# Patient Record
Sex: Male | Born: 1950 | Race: Black or African American | Hispanic: No | Marital: Single | Smoking: Never smoker
Health system: Southern US, Community
[De-identification: ages and names within clinical notes are randomized; demographics above are authoritative.]

## PROBLEM LIST (undated history)

## (undated) DIAGNOSIS — I10 Essential (primary) hypertension: Secondary | ICD-10-CM

---

## 2015-06-30 ENCOUNTER — Emergency Department (HOSPITAL_COMMUNITY): Payer: Self-pay

## 2015-06-30 ENCOUNTER — Encounter (HOSPITAL_COMMUNITY): Payer: Self-pay | Admitting: Emergency Medicine

## 2015-06-30 ENCOUNTER — Emergency Department (HOSPITAL_COMMUNITY)
Admission: EM | Admit: 2015-06-30 | Discharge: 2015-06-30 | Disposition: A | Discharge status: Home / Self Care | Payer: Self-pay | Attending: Emergency Medicine | Admitting: Emergency Medicine

## 2015-06-30 DIAGNOSIS — I10 Essential (primary) hypertension: Secondary | ICD-10-CM | POA: Insufficient documentation

## 2015-06-30 DIAGNOSIS — M791 Myalgia, unspecified site: Secondary | ICD-10-CM

## 2015-06-30 HISTORY — DX: Essential (primary) hypertension: I10

## 2015-06-30 LAB — CBC WITH DIFFERENTIAL/PLATELET
BASOS ABS: 0 10*3/uL (ref 0.0–0.1)
Basophils Relative: 0 %
EOS PCT: 2 %
Eosinophils Absolute: 0.1 10*3/uL (ref 0.0–0.7)
HEMATOCRIT: 39.7 % (ref 39.0–52.0)
Hemoglobin: 12.9 g/dL — ABNORMAL LOW (ref 13.0–17.0)
LYMPHS ABS: 2.6 10*3/uL (ref 0.7–4.0)
LYMPHS PCT: 44 %
MCH: 26.7 pg (ref 26.0–34.0)
MCHC: 32.5 g/dL (ref 30.0–36.0)
MCV: 82 fL (ref 78.0–100.0)
MONO ABS: 0.5 10*3/uL (ref 0.1–1.0)
MONOS PCT: 9 %
NEUTROS ABS: 2.7 10*3/uL (ref 1.7–7.7)
Neutrophils Relative %: 45 %
PLATELETS: 207 10*3/uL (ref 150–400)
RBC: 4.84 MIL/uL (ref 4.22–5.81)
RDW: 13.1 % (ref 11.5–15.5)
WBC: 6 10*3/uL (ref 4.0–10.5)

## 2015-06-30 LAB — BASIC METABOLIC PANEL
ANION GAP: 5 (ref 5–15)
BUN: 15 mg/dL (ref 6–20)
CHLORIDE: 111 mmol/L (ref 101–111)
CO2: 26 mmol/L (ref 22–32)
Calcium: 9.3 mg/dL (ref 8.9–10.3)
Creatinine, Ser: 0.91 mg/dL (ref 0.61–1.24)
GFR calc Af Amer: >60 mL/min (ref >60)
GFR calc non Af Amer: >60 mL/min (ref >60)
GLUCOSE: 90 mg/dL (ref 65–99)
POTASSIUM: 3.8 mmol/L (ref 3.5–5.1)
Sodium: 142 mmol/L (ref 135–145)

## 2015-06-30 LAB — I-STAT CHEM 8, ED
BUN: 15 mg/dL (ref 6–20)
CALCIUM ION: 1.24 mmol/L (ref 1.13–1.30)
CREATININE: 0.9 mg/dL (ref 0.61–1.24)
Chloride: 108 mmol/L (ref 101–111)
GLUCOSE: 87 mg/dL (ref 65–99)
HCT: 42 % (ref 39.0–52.0)
HEMOGLOBIN: 14.3 g/dL (ref 13.0–17.0)
POTASSIUM: 3.7 mmol/L (ref 3.5–5.1)
Sodium: 144 mmol/L (ref 135–145)
TCO2: 23 mmol/L (ref 0–100)

## 2015-06-30 LAB — I-STAT TROPONIN, ED
TROPONIN I, POC: 0 ng/mL (ref 0.00–0.08)
Troponin i, poc: 0.01 ng/mL (ref 0.00–0.08)

## 2015-06-30 MED ORDER — KETOROLAC TROMETHAMINE 30 MG/ML IJ SOLN
30.0000 mg | Freq: Once | INTRAMUSCULAR | Status: AC
  Administered 2015-06-30: 30 mg via INTRAVENOUS
  Filled 2015-06-30: qty 1

## 2015-06-30 MED ORDER — NAPROXEN 375 MG PO TABS: 375.0000 mg | ORAL_TABLET | Freq: Two times a day (BID) | ORAL | Status: AC

## 2015-06-30 MED ORDER — GI COCKTAIL ~~LOC~~
30.0000 mL | Freq: Once | ORAL | Status: AC
Start: 2015-06-30 — End: 2015-06-30
  Administered 2015-06-30: 30 mL via ORAL
  Filled 2015-06-30: qty 30

## 2015-06-30 MED ORDER — AMLODIPINE BESYLATE 5 MG PO TABS: 5.0000 mg | ORAL_TABLET | Freq: Every day | ORAL | Status: AC

## 2015-06-30 MED ORDER — IOHEXOL 350 MG/ML SOLN
100.0000 mL | Freq: Once | INTRAVENOUS | Status: AC | PRN
  Administered 2015-06-30: 100 mL via INTRAVENOUS

## 2015-06-30 MED ORDER — METHOCARBAMOL 500 MG PO TABS
1000.0000 mg | ORAL_TABLET | Freq: Once | ORAL | Status: AC
  Administered 2015-06-30: 1000 mg via ORAL
  Filled 2015-06-30: qty 2

## 2015-06-30 MED ORDER — AMLODIPINE BESYLATE 5 MG PO TABS
5.0000 mg | ORAL_TABLET | Freq: Once | ORAL | Status: AC
  Administered 2015-06-30: 5 mg via ORAL
  Filled 2015-06-30: qty 1

## 2015-06-30 NOTE — ED Provider Notes (Signed)
CSN: [id number]     Arrival date & time 06/30/15  0344 History   First MD Initiated Contact with Patient 06/30/15 0354     Chief Complaint  Patient presents with  . Chest Pain     (Consider location/radiation/quality/duration/timing/severity/associated sxs/prior Treatment) Patient is a 64 y.o. male presenting with chest pain. The history is provided by the patient.  Chest Pain Pain location:  L chest Radiates to: back and legs to feet. Pain radiates to the back: yes   Pain severity:  Severe Onset quality:  Sudden Timing:  Constant Progression:  Unchanged Chronicity:  New Context: at rest   Relieved by:  Nothing Worsened by:  Nothing tried Ineffective treatments:  None tried Associated symptoms: no cough, no headache, no lower extremity edema, no palpitations, no shortness of breath and not vomiting   Risk factors: no surgery     Past Medical History  Diagnosis Date  . Hypertension    History reviewed. No pertinent past surgical history. No family history on file. Social History  Substance Use Topics  . Smoking status: Never Smoker   . Smokeless tobacco: None  . Alcohol Use: No    Review of Systems  Respiratory: Negative for cough and shortness of breath.   Cardiovascular: Positive for chest pain. Negative for palpitations and leg swelling.  Gastrointestinal: Negative for vomiting.  Neurological: Negative for headaches.  All other systems reviewed and are negative.     Allergies  Review of patient's allergies indicates no known allergies.  Home Medications   Prior to Admission medications   Not on File   There were no vitals taken for this visit. Physical Exam  Constitutional: He is oriented to person, place, and time. He appears well-developed and well-nourished. No distress.  HENT:  Head: Normocephalic and atraumatic.  Mouth/Throat: Oropharynx is clear and moist.  Eyes: Conjunctivae are normal. Pupils are equal, round, and reactive to light.  Neck:  Normal range of motion. Neck supple.  Cardiovascular: Normal rate, regular rhythm and intact distal pulses.   Pulmonary/Chest: Effort normal and breath sounds normal. No respiratory distress. He has no wheezes. He has no rales. He exhibits no tenderness.  Abdominal: Soft. Bowel sounds are normal. There is no tenderness. There is no rebound and no guarding.  Musculoskeletal: Normal range of motion.  Neurological: He is alert and oriented to person, place, and time.  Skin: Skin is warm and dry.  Psychiatric: He has a normal mood and affect.    ED Course  Procedures (including critical care time) Labs Review Labs Reviewed  BASIC METABOLIC PANEL  CBC  CBC WITH DIFFERENTIAL/PLATELET  I-STAT CHEM 8, ED  I-STAT TROPOININ, ED    Imaging Review No results found. I have personally reviewed and evaluated these images and lab results as part of my medical decision-making.   EKG Interpretation None      MDM   Final diagnoses:  None     EKG Interpretation  Date/Time:  Sunday June 30 2015 03:46:21 EDT Ventricular Rate:  53 PR Interval:  96 QRS Duration: 85 QT Interval:  223 QTC Calculation: 209 R Axis:   78 Text Interpretation:  Sinus rhythm Short PR interval Confirmed by [facility]  MD, Cieara Stierwalt ([id number]) on 06/30/2015 3:57:12 AM      Results for orders placed or performed during the hospital encounter of 06/30/15  Basic metabolic panel  Result Value Ref Range   Sodium 142 135 - 145 mmol/L   Potassium 3.8 3.5 - 5.1 mmol/L  Chloride 111 101 - 111 mmol/L   CO2 26 22 - 32 mmol/L   Glucose, Bld 90 65 - 99 mg/dL   BUN 15 6 - 20 mg/dL   Creatinine, Ser 1.61 0.61 - 1.24 mg/dL   Calcium 9.3 8.9 - 09.6 mg/dL   GFR calc non Af Amer >60 >60 mL/min   GFR calc Af Amer >60 >60 mL/min   Anion gap 5 5 - 15  CBC with Differential/Platelet  Result Value Ref Range   WBC 6.0 4.0 - 10.5 K/uL   RBC 4.84 4.22 - 5.81 MIL/uL   Hemoglobin 12.9 (L) 13.0 - 17.0 g/dL   HCT 04.5 40.9 - 81.1  %   MCV 82.0 78.0 - 100.0 fL   MCH 26.7 26.0 - 34.0 pg   MCHC 32.5 30.0 - 36.0 g/dL   RDW 91.4 78.2 - 95.6 %   Platelets 207 150 - 400 K/uL   Neutrophils Relative % 45 %   Neutro Abs 2.7 1.7 - 7.7 K/uL   Lymphocytes Relative 44 %   Lymphs Abs 2.6 0.7 - 4.0 K/uL   Monocytes Relative 9 %   Monocytes Absolute 0.5 0.1 - 1.0 K/uL   Eosinophils Relative 2 %   Eosinophils Absolute 0.1 0.0 - 0.7 K/uL   Basophils Relative 0 %   Basophils Absolute 0.0 0.0 - 0.1 K/uL  I-Stat Chem 8, ED  Result Value Ref Range   Sodium 144 135 - 145 mmol/L   Potassium 3.7 3.5 - 5.1 mmol/L   Chloride 108 101 - 111 mmol/L   BUN 15 6 - 20 mg/dL   Creatinine, Ser 2.13 0.61 - 1.24 mg/dL   Glucose, Bld 87 65 - 99 mg/dL   Calcium, Ion 0.86 5.78 - 1.30 mmol/L   TCO2 23 0 - 100 mmol/L   Hemoglobin 14.3 13.0 - 17.0 g/dL   HCT 46.9 62.9 - 52.8 %  I-stat troponin, ED  Result Value Ref Range   Troponin i, poc 0.00 0.00 - 0.08 ng/mL   Comment 3          I-stat troponin, ED  Result Value Ref Range   Troponin i, poc 0.01 0.00 - 0.08 ng/mL   Comment 3           Dg Chest Portable 1 View  06/30/2015   CLINICAL DATA:  Acute onset of left-sided chest pain, radiating to the back and left arm. Nausea. Initial encounter.  EXAM: PORTABLE CHEST 1 VIEW  COMPARISON:  None.  FINDINGS: The lungs are well-aerated. Vascular congestion is noted, with increased interstitial markings, concerning for mild interstitial edema. There is no evidence of pleural effusion or pneumothorax. The  The cardiomediastinal silhouette is mildly enlarged. No acute osseous abnormalities are seen.  IMPRESSION: Vascular congestion and mild cardiomegaly, with increased interstitial markings, concerning for mild interstitial edema.   Electronically Signed   By: Roanna Raider M.D.   On: 06/30/2015 04:23   Ct Angio Chest Aorta W/cm &/or Wo/cm  06/30/2015   CLINICAL DATA:  LEFT chest pain shooting to the low back, LEFT arm pain, bilateral leg pain beginning at  2100 hours. Nausea. History of hypertension.  EXAM: CT ANGIOGRAPHY CHEST, ABDOMEN AND PELVIS  TECHNIQUE: Multidetector CT imaging through the chest, abdomen and pelvis was performed using the standard protocol during bolus administration of intravenous contrast. Multiplanar reconstructed images and MIPs were obtained and reviewed to evaluate the vascular anatomy.  CONTRAST:  OMNIPAQUE IOHEXOL 350 MG/ML SOLN  COMPARISON:  Chest  radiograph June 30, 2015 at 4:07 a.m.  FINDINGS: CTA CHEST FINDINGS  VASCULAR STRUCTURES: No abnormal density along the thoracic aorta on noncontrast CT. Thoracic aorta is normal in course and caliber, no suspicious luminal irregularity, dissection, aneurysm, significant stenosis, contrast extravasation or periaortic fluid collections. Two vessel aortic arch is a normal variant. Though not tailored for evaluation, no central pulmonary embolism.  MEDIASTINUM: The heart is mildly enlarged, no pericardial effusions. No lymphadenopathy by CT size criteria.  LUNGS: Tracheobronchial tree is patent and midline, no pneumothorax. Mild pulmonary vascular congestion. Dependent atelectasis. No pleural effusion or focal consolidation.  SOFT TISSUES AND OSSEOUS STRUCTURES: Soft tissues are normal. Moderate to severe partially imaged C7-T1 degenerative disc. Probable RIGHT neural foraminal narrowing.  Review of the MIP images confirms the above findings.  CTA ABDOMEN AND PELVIS FINDINGS  VASCULAR STRUCTURES: Aortoiliac vessels are normal in course and caliber, no suspicious luminal irregularity, dissection, aneurysm, significant stenosis, contrast extravasation or periaortic fluid collections. The celiac access, superior and inferior mesenteric arteries are widely patent. Trace calcific atherosclerosis of the aortoiliac vessels.  SOLID ORGANS: The liver, spleen, gallbladder, pancreas and adrenal glands are unremarkable.  GASTROINTESTINAL TRACT: The stomach, small and large bowel are normal in course  and caliber without inflammatory changes. Moderate amount of retained large bowel stool. Mild sigmoid diverticulosis. Normal appendix.  KIDNEYS/ URINARY TRACT: Kidneys are orthotopic, demonstrating symmetric enhancement. No nephrolithiasis, hydronephrosis or solid renal masses. The unopacified ureters are normal in course and caliber. Delayed imaging through the kidneys demonstrates symmetric prompt contrast excretion within the proximal urinary collecting system. Urinary bladder is partially distended and unremarkable.  PERITONEUM/RETROPERITONEUM: No lymphadenopathy by CT size criteria. Prostate is mildly enlarged, invading the base the bladder. No intraperitoneal free fluid nor free air.  SOFT TISSUE/OSSEOUS STRUCTURES: Non-suspicious. Severe L4-5 disc height loss, vacuum disc and endplate spurring resulting in severe LEFT, moderate to severe RIGHT neural foraminal narrowing. Scattered Schmorl's nodes. Small fat containing umbilical hernia.  Review of the MIP images confirms the above findings.  IMPRESSION: CTA CHEST: No acute vascular process or vascular etiology for chest pain.  Mild cardiomegaly and pulmonary vascular congestion.  CTA ABDOMEN AND PELVIS: No acute vascular process or vascular etiology for leg pain.  No acute intra-abdominal or pelvic process.   Electronically Signed   By: Awilda Metro M.D.   On: 06/30/2015 05:39   Ct Angio Abd/pel W/ And/or W/o  06/30/2015   CLINICAL DATA:  LEFT chest pain shooting to the low back, LEFT arm pain, bilateral leg pain beginning at 2100 hours. Nausea. History of hypertension.  EXAM: CT ANGIOGRAPHY CHEST, ABDOMEN AND PELVIS  TECHNIQUE: Multidetector CT imaging through the chest, abdomen and pelvis was performed using the standard protocol during bolus administration of intravenous contrast. Multiplanar reconstructed images and MIPs were obtained and reviewed to evaluate the vascular anatomy.  CONTRAST:  OMNIPAQUE IOHEXOL 350 MG/ML SOLN  COMPARISON:   Chest radiograph June 30, 2015 at 4:07 a.m.  FINDINGS: CTA CHEST FINDINGS  VASCULAR STRUCTURES: No abnormal density along the thoracic aorta on noncontrast CT. Thoracic aorta is normal in course and caliber, no suspicious luminal irregularity, dissection, aneurysm, significant stenosis, contrast extravasation or periaortic fluid collections. Two vessel aortic arch is a normal variant. Though not tailored for evaluation, no central pulmonary embolism.  MEDIASTINUM: The heart is mildly enlarged, no pericardial effusions. No lymphadenopathy by CT size criteria.  LUNGS: Tracheobronchial tree is patent and midline, no pneumothorax. Mild pulmonary vascular congestion. Dependent atelectasis. No pleural effusion or  focal consolidation.  SOFT TISSUES AND OSSEOUS STRUCTURES: Soft tissues are normal. Moderate to severe partially imaged C7-T1 degenerative disc. Probable RIGHT neural foraminal narrowing.  Review of the MIP images confirms the above findings.  CTA ABDOMEN AND PELVIS FINDINGS  VASCULAR STRUCTURES: Aortoiliac vessels are normal in course and caliber, no suspicious luminal irregularity, dissection, aneurysm, significant stenosis, contrast extravasation or periaortic fluid collections. The celiac access, superior and inferior mesenteric arteries are widely patent. Trace calcific atherosclerosis of the aortoiliac vessels.  SOLID ORGANS: The liver, spleen, gallbladder, pancreas and adrenal glands are unremarkable.  GASTROINTESTINAL TRACT: The stomach, small and large bowel are normal in course and caliber without inflammatory changes. Moderate amount of retained large bowel stool. Mild sigmoid diverticulosis. Normal appendix.  KIDNEYS/ URINARY TRACT: Kidneys are orthotopic, demonstrating symmetric enhancement. No nephrolithiasis, hydronephrosis or solid renal masses. The unopacified ureters are normal in course and caliber. Delayed imaging through the kidneys demonstrates symmetric prompt contrast excretion within  the proximal urinary collecting system. Urinary bladder is partially distended and unremarkable.  PERITONEUM/RETROPERITONEUM: No lymphadenopathy by CT size criteria. Prostate is mildly enlarged, invading the base the bladder. No intraperitoneal free fluid nor free air.  SOFT TISSUE/OSSEOUS STRUCTURES: Non-suspicious. Severe L4-5 disc height loss, vacuum disc and endplate spurring resulting in severe LEFT, moderate to severe RIGHT neural foraminal narrowing. Scattered Schmorl's nodes. Small fat containing umbilical hernia.  Review of the MIP images confirms the above findings.  IMPRESSION: CTA CHEST: No acute vascular process or vascular etiology for chest pain.  Mild cardiomegaly and pulmonary vascular congestion.  CTA ABDOMEN AND PELVIS: No acute vascular process or vascular etiology for leg pain.  No acute intra-abdominal or pelvic process.   Electronically Signed   By: Awilda Metro M.D.   On: 06/30/2015 05:39     Medications  amLODipine (NORVASC) tablet 5 mg (not administered)  gi cocktail (Maalox,Lidocaine,Donnatal) (30 mLs Oral Given 06/30/15 0446)  ketorolac (TORADOL) 30 MG/ML injection 30 mg (30 mg Intravenous Given 06/30/15 0446)  iohexol (OMNIPAQUE) 350 MG/ML injection 100 mL (100 mLs Intravenous Contrast Given 06/30/15 0514)  methocarbamol (ROBAXIN) tablet 1,000 mg (1,000 mg Oral Given 06/30/15 0625)  heart score 1   Rules out for MI with a normal EKG and 2 negative troponins.  Given pain was from chest to back and down to toes ruled out for dissection.  Now resting comfortably post meds.  Will restart patient's BP meds and send home on NSAIDs for pain.      Cy Blamer, MD 06/30/15 [phone]

## 2015-06-30 NOTE — ED Notes (Signed)
MD at bedside. 

## 2015-06-30 NOTE — ED Notes (Signed)
MD Palumbo at bedside. 

## 2015-06-30 NOTE — ED Notes (Addendum)
Pt from home c/o L chest pain that shoots to back pain, L arm pain, and bilateral leg pain since 2100.He reports nausea.

## 2015-06-30 NOTE — Discharge Instructions (Signed)
DASH Eating Plan  DASH stands for "Dietary Approaches to Stop Hypertension." The DASH eating plan is a healthy eating plan that has been shown to reduce high blood pressure (hypertension). Additional health benefits may include reducing the risk of type 2 diabetes mellitus, heart disease, and stroke. The DASH eating plan may also help with weight loss.  WHAT DO I NEED TO KNOW ABOUT THE DASH EATING PLAN?  For the DASH eating plan, you will follow these general guidelines:  · Choose foods with a percent daily value for sodium of less than 5% (as listed on the food label).  · Use salt-free seasonings or herbs instead of table salt or sea salt.  · Check with your health care provider or pharmacist before using salt substitutes.  · Eat lower-sodium products, often labeled as "lower sodium" or "no salt added."  · Eat fresh foods.  · Eat more vegetables, fruits, and low-fat dairy products.  · Choose whole grains. Look for the word "whole" as the first word in the ingredient list.  · Choose fish and skinless chicken or turkey more often than red meat. Limit fish, poultry, and meat to 6 oz (170 g) each day.  · Limit sweets, desserts, sugars, and sugary drinks.  · Choose heart-healthy fats.  · Limit cheese to 1 oz (28 g) per day.  · Eat more home-cooked food and less restaurant, buffet, and fast food.  · Limit fried foods.  · Cook foods using methods other than frying.  · Limit canned vegetables. If you do use them, rinse them well to decrease the sodium.  · When eating at a restaurant, ask that your food be prepared with less salt, or no salt if possible.  WHAT FOODS CAN I EAT?  Seek help from a dietitian for individual calorie needs.  Grains  Whole grain or whole wheat bread. Brown rice. Whole grain or whole wheat pasta. Quinoa, bulgur, and whole grain cereals. Low-sodium cereals. Corn or whole wheat flour tortillas. Whole grain cornbread. Whole grain crackers. Low-sodium crackers.  Vegetables  Fresh or frozen vegetables  (raw, steamed, roasted, or grilled). Low-sodium or reduced-sodium tomato and vegetable juices. Low-sodium or reduced-sodium tomato sauce and paste. Low-sodium or reduced-sodium canned vegetables.   Fruits  All fresh, canned (in natural juice), or frozen fruits.  Meat and Other Protein Products  Ground beef (85% or leaner), grass-fed beef, or beef trimmed of fat. Skinless chicken or turkey. Ground chicken or turkey. Pork trimmed of fat. All fish and seafood. Eggs. Dried beans, peas, or lentils. Unsalted nuts and seeds. Unsalted canned beans.  Dairy  Low-fat dairy products, such as skim or 1% milk, 2% or reduced-fat cheeses, low-fat ricotta or cottage cheese, or plain low-fat yogurt. Low-sodium or reduced-sodium cheeses.  Fats and Oils  Tub margarines without trans fats. Light or reduced-fat mayonnaise and salad dressings (reduced sodium). Avocado. Safflower, olive, or canola oils. Natural peanut or almond butter.  Other  Unsalted popcorn and pretzels.  The items listed above may not be a complete list of recommended foods or beverages. Contact your dietitian for more options.  WHAT FOODS ARE NOT RECOMMENDED?  Grains  White bread. White pasta. White rice. Refined cornbread. Bagels and croissants. Crackers that contain trans fat.  Vegetables  Creamed or fried vegetables. Vegetables in a cheese sauce. Regular canned vegetables. Regular canned tomato sauce and paste. Regular tomato and vegetable juices.  Fruits  Dried fruits. Canned fruit in light or heavy syrup. Fruit juice.  Meat and Other Protein   Products  Fatty cuts of meat. Ribs, chicken wings, bacon, sausage, bologna, salami, chitterlings, fatback, hot dogs, bratwurst, and packaged luncheon meats. Salted nuts and seeds. Canned beans with salt.  Dairy  Whole or 2% milk, cream, half-and-half, and cream cheese. Whole-fat or sweetened yogurt. Full-fat cheeses or blue cheese. Nondairy creamers and whipped toppings. Processed cheese, cheese spreads, or cheese  curds.  Condiments  Onion and garlic salt, seasoned salt, table salt, and sea salt. Canned and packaged gravies. Worcestershire sauce. Tartar sauce. Barbecue sauce. Teriyaki sauce. Soy sauce, including reduced sodium. Steak sauce. Fish sauce. Oyster sauce. Cocktail sauce. Horseradish. Ketchup and mustard. Meat flavorings and tenderizers. Bouillon cubes. Hot sauce. Tabasco sauce. Marinades. Taco seasonings. Relishes.  Fats and Oils  Butter, stick margarine, lard, shortening, ghee, and bacon fat. Coconut, palm kernel, or palm oils. Regular salad dressings.  Other  Pickles and olives. Salted popcorn and pretzels.  The items listed above may not be a complete list of foods and beverages to avoid. Contact your dietitian for more information.  WHERE CAN I FIND MORE INFORMATION?  [employer], [employer], [employer]: www.nhlbi.nih.gov/health/health-topics/topics/dash/     This information is not intended to replace advice given to you by your health care provider. Make sure you discuss any questions you have with your health care provider.     Document Released: 08/27/2011 Document Revised: 09/28/2014 Document Reviewed: 07/12/2013  Elsevier Interactive Patient Education ©2016 [employer]

## 2015-06-30 NOTE — ED Notes (Addendum)
Pt alert,oriented, and ambulatory upon DC. He was advised to follow up with PCP about hypertension. He verbalizes understanding.

## 2017-04-07 IMAGING — CT CT CTA ABD/PEL W/CM AND/OR W/O CM
2 of 6 series · 14 of 46 positions shown, 16 images · IV contrast (omnipaque)
Comparison: Chest radiograph June 30, 2015 at [DATE] a.m.

CLINICAL DATA: LEFT chest pain shooting to the low back, LEFT arm
pain, bilateral leg pain beginning at 2488 hours. Nausea. History of
hypertension.

EXAM:
CT ANGIOGRAPHY CHEST, ABDOMEN AND PELVIS
TECHNIQUE: Multidetector CT imaging through the chest, abdomen and pelvis was
performed using the standard protocol during bolus administration of
intravenous contrast. Multiplanar reconstructed images and MIPs were
obtained and reviewed to evaluate the vascular anatomy.
CONTRAST:  100mL OMNIPAQUE IOHEXOL 350 MG/ML SOLN

[Series 5: arterial 3.0 b30f · axial · arterial · 0.72mm/px · z∈[-569,-83]mm · 11 of 196 slices shown, 13 images]
[im 17/196  soft-tissue]
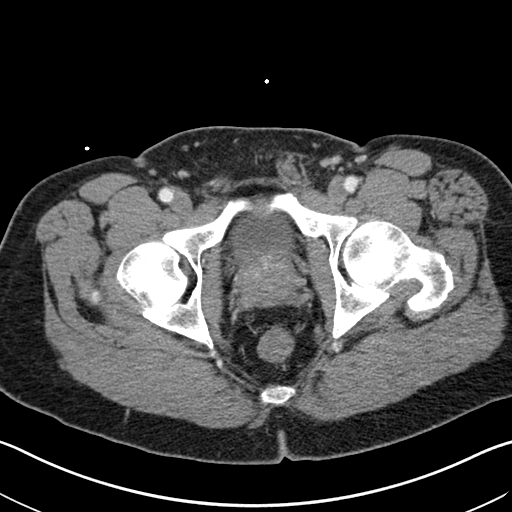
[im 17/196  bone]
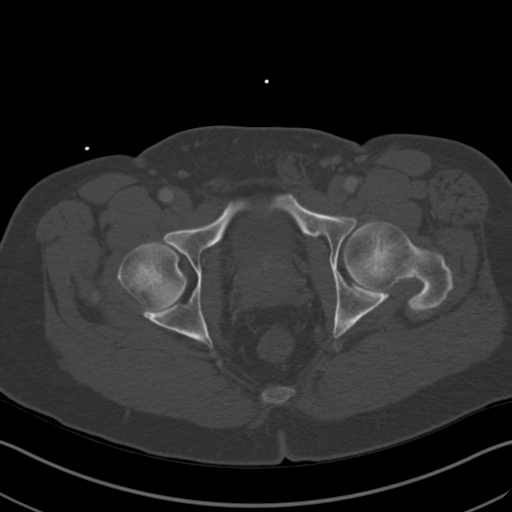
[im 33/196  soft-tissue]
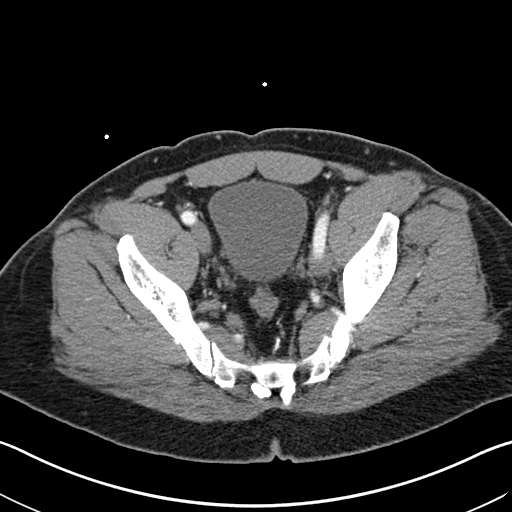
[im 49/196  soft-tissue]
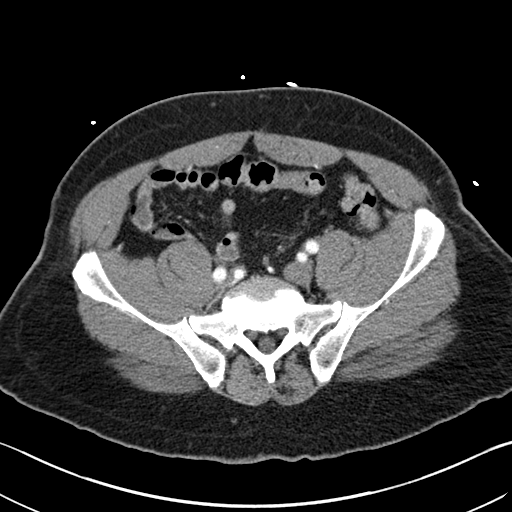
[im 66/196  soft-tissue]
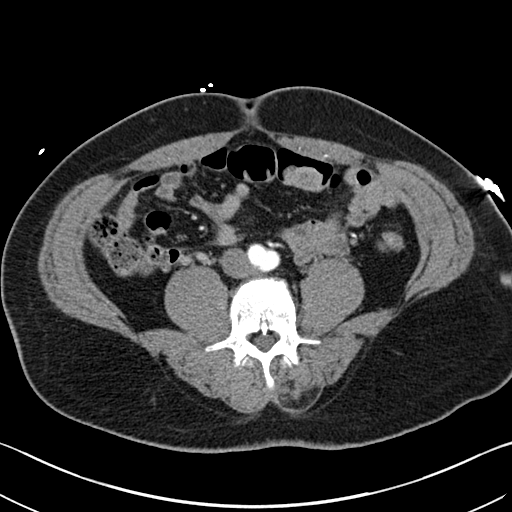
[im 82/196  soft-tissue]
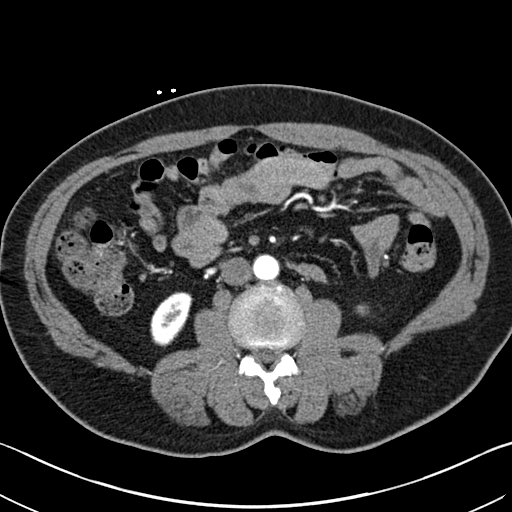
[im 98/196  soft-tissue]
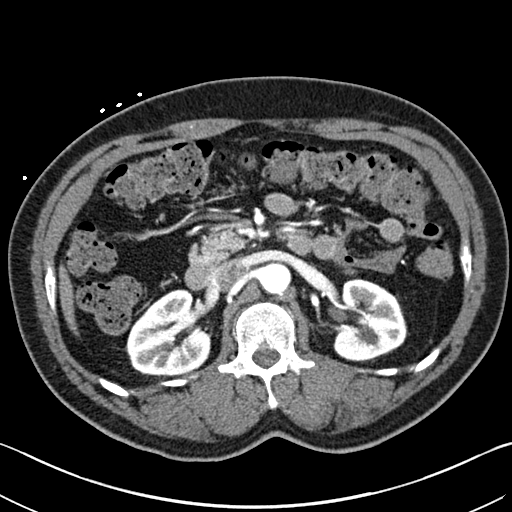
[im 114/196  soft-tissue]
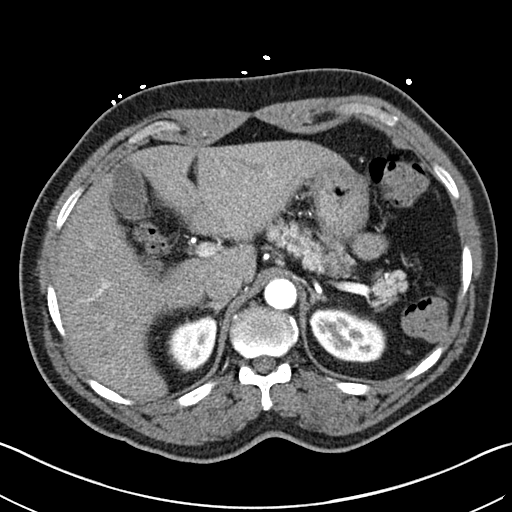
[im 131/196  soft-tissue]
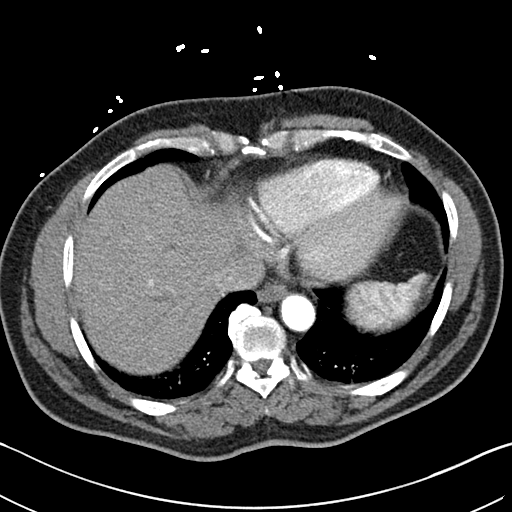
[im 147/196  soft-tissue]
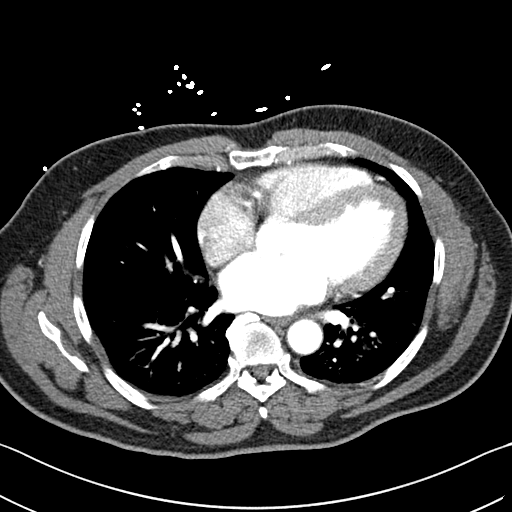
[im 147/196  bone]
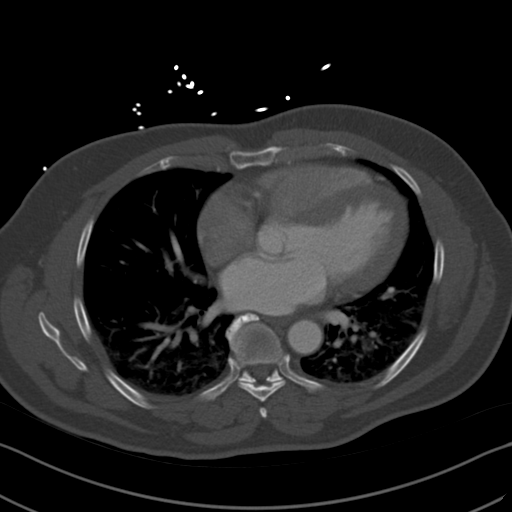
[im 163/196  soft-tissue]
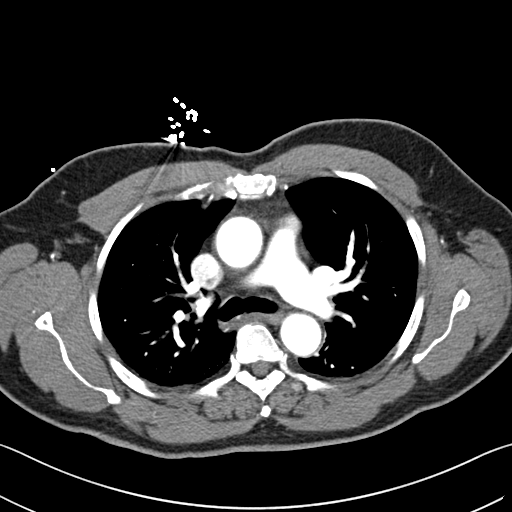
[im 179/196  soft-tissue]
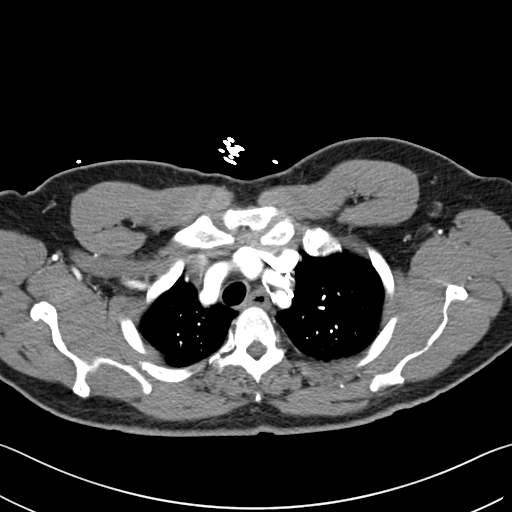

[Series 8: coronal mpr · coronal · 0.86mm/px · 3 of 141 slices shown]
[im 36/141  soft-tissue]
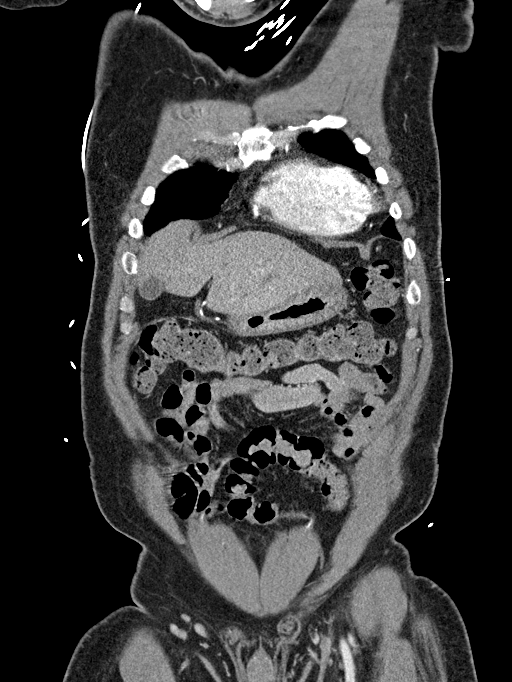
[im 71/141  soft-tissue]
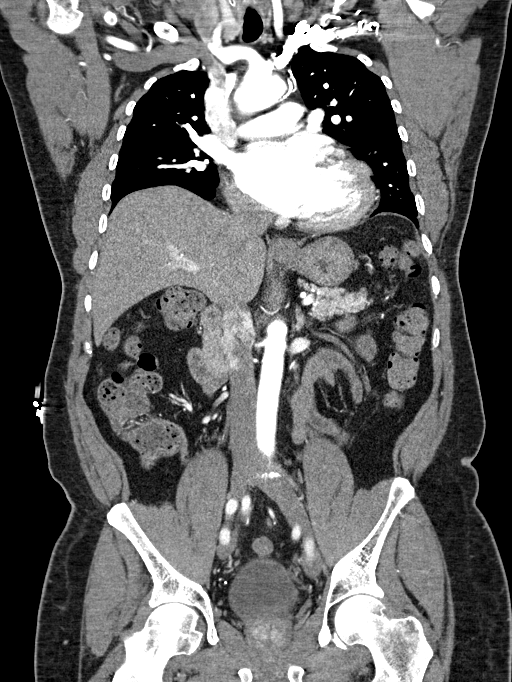
[im 106/141  soft-tissue]
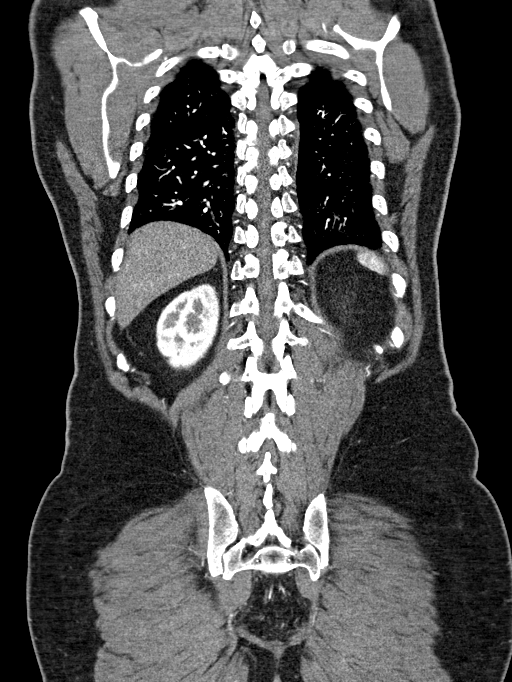

[14 of 46 positions shown; findings below may reference images not displayed]

FINDINGS: CTA CHEST FINDINGS

VASCULAR STRUCTURES: No abnormal density along the thoracic aorta on
noncontrast CT. Thoracic aorta is normal in course and caliber, no
suspicious luminal irregularity, dissection, aneurysm, significant
stenosis, contrast extravasation or periaortic fluid collections.
Two vessel aortic arch is a normal variant. Though not tailored for
evaluation, no central pulmonary embolism.

MEDIASTINUM: The heart is mildly enlarged, no pericardial effusions.
No lymphadenopathy by CT size criteria.

LUNGS: Tracheobronchial tree is patent and midline, no pneumothorax.
Mild pulmonary vascular congestion. Dependent atelectasis. No
pleural effusion or focal consolidation.

SOFT TISSUES AND OSSEOUS STRUCTURES: Soft tissues are normal.
Moderate to severe partially imaged C7-T1 degenerative disc.
Probable RIGHT neural foraminal narrowing.

Review of the MIP images confirms the above findings.

CTA ABDOMEN AND PELVIS FINDINGS

VASCULAR STRUCTURES: Aortoiliac vessels are normal in course and
caliber, no suspicious luminal irregularity, dissection, aneurysm,
significant stenosis, contrast extravasation or periaortic fluid
collections. The celiac access, superior and inferior mesenteric
arteries are widely patent. Trace calcific atherosclerosis of the
aortoiliac vessels.

SOLID ORGANS: The liver, spleen, gallbladder, pancreas and adrenal
glands are unremarkable.

GASTROINTESTINAL TRACT: The stomach, small and large bowel are
normal in course and caliber without inflammatory changes. Moderate
amount of retained large bowel stool. Mild sigmoid diverticulosis.
Normal appendix.

KIDNEYS/ URINARY TRACT: Kidneys are orthotopic, demonstrating
symmetric enhancement. No nephrolithiasis, hydronephrosis or solid
renal masses. The unopacified ureters are normal in course and
caliber. Delayed imaging through the kidneys demonstrates symmetric
prompt contrast excretion within the proximal urinary collecting
system. Urinary bladder is partially distended and unremarkable.

PERITONEUM/RETROPERITONEUM: No lymphadenopathy by CT size criteria.
Prostate is mildly enlarged, invading the base the bladder. No
intraperitoneal free fluid nor free air.

SOFT TISSUE/OSSEOUS STRUCTURES: Non-suspicious. Severe L4-5 disc
height loss, vacuum disc and endplate spurring resulting in severe
LEFT, moderate to severe RIGHT neural foraminal narrowing. Scattered
Schmorl's nodes. Small fat containing umbilical hernia.

Review of the MIP images confirms the above findings.
IMPRESSION: CTA CHEST: No acute vascular process or vascular etiology for chest
pain.

Mild cardiomegaly and pulmonary vascular congestion.

CTA ABDOMEN AND PELVIS: No acute vascular process or vascular
etiology for leg pain.

No acute intra-abdominal or pelvic process.

## 2017-04-07 IMAGING — DX DG CHEST 1V PORT
1 series · 1 of 1 positions shown · non-contrast
Comparison: None.

CLINICAL DATA: Acute onset of left-sided chest pain, radiating to
the back and left arm. Nausea. Initial encounter.

EXAM:
PORTABLE CHEST 1 VIEW

[chest ap]
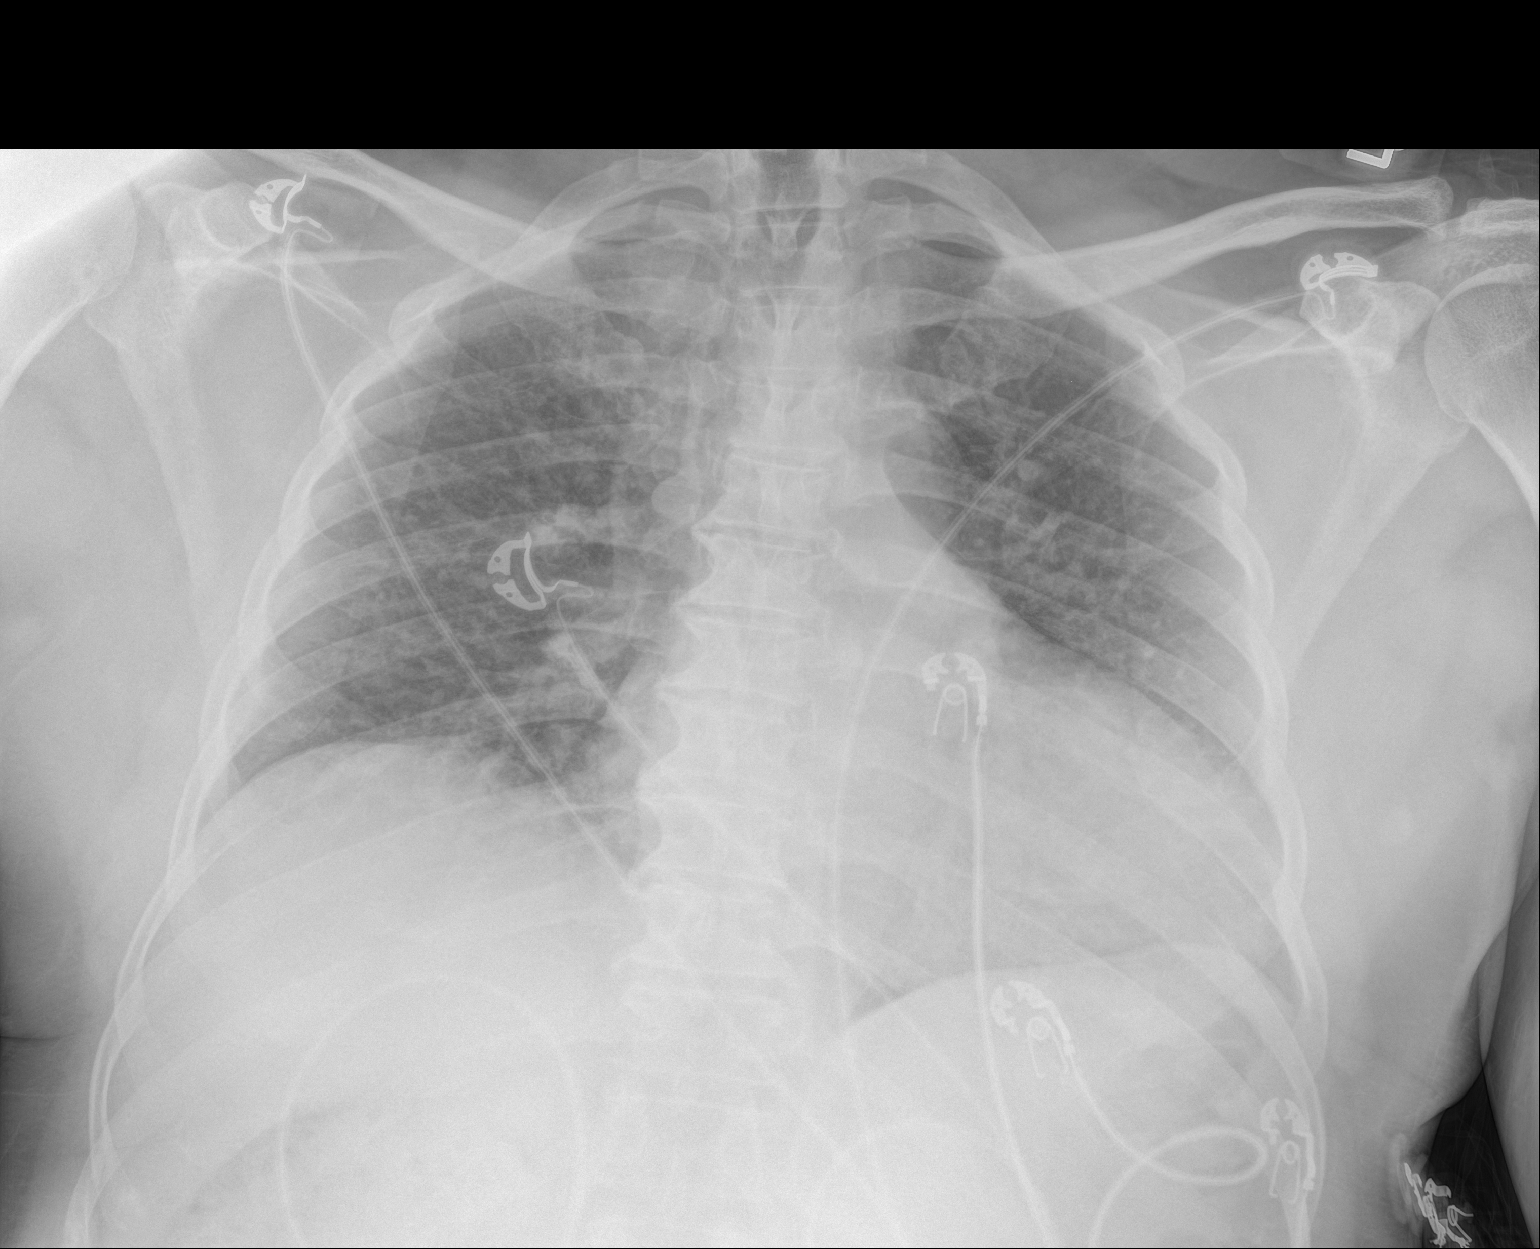

[1 of 1 positions shown; findings below may reference images not displayed]

FINDINGS: The lungs are well-aerated. Vascular congestion is noted, with
increased interstitial markings, concerning for mild interstitial
edema. There is no evidence of pleural effusion or pneumothorax. The

The cardiomediastinal silhouette is mildly enlarged. No acute
osseous abnormalities are seen.
IMPRESSION: Vascular congestion and mild cardiomegaly, with increased
interstitial markings, concerning for mild interstitial edema.
